# Patient Record
Sex: Male | Born: 1990 | Race: Black or African American | Hispanic: No | Smoking: Never smoker
Health system: Southern US, Community
[De-identification: ages and names within clinical notes are randomized; demographics above are authoritative.]

---
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
## 2017-04-21 ENCOUNTER — Encounter: Payer: Self-pay | Admitting: Emergency Medicine

## 2017-04-21 ENCOUNTER — Emergency Department
Admission: EM | Admit: 2017-04-21 | Discharge: 2017-04-21 | Disposition: A | Discharge status: Home / Self Care | Payer: Self-pay | Attending: Internal Medicine | Admitting: Internal Medicine

## 2017-04-21 ENCOUNTER — Other Ambulatory Visit: Payer: Self-pay

## 2017-04-21 DIAGNOSIS — J4540 Moderate persistent asthma, uncomplicated: Secondary | ICD-10-CM | POA: Insufficient documentation

## 2017-04-21 MED ORDER — IPRATROPIUM-ALBUTEROL 0.5-2.5 (3) MG/3ML IN SOLN
3.0000 mL | Freq: Once | RESPIRATORY_TRACT | Status: AC
Start: 2017-04-21 — End: 2017-04-21
  Administered 2017-04-21: 3 mL via RESPIRATORY_TRACT
  Filled 2017-04-21 (×2): qty 3

## 2017-04-21 MED ORDER — AZITHROMYCIN 250 MG PO TABS: ORAL_TABLET | ORAL | 0 refills | Status: DC

## 2017-04-21 MED ORDER — PREDNISONE 10 MG PO TABS: 50.0000 mg | ORAL_TABLET | Freq: Every day | ORAL | 0 refills | Status: DC

## 2017-04-21 NOTE — ED Triage Notes (Signed)
Pt to ed with c/o cough, congestion, sore throat x 1 week.

## 2017-04-21 NOTE — ED Provider Notes (Signed)
[facility] Emergency Department Provider Note  ____________________________________________  Time seen: Approximately 1:26 PM  I have reviewed the triage vital signs and the nursing notes.   HISTORY  Chief Complaint Nasal Congestion; Cough; and Sore Throat   HPI Marc Carson is a 27 y.o. male who presents to the emergency department for treatment and evaluation of nasal congestion cough, and sore throat that started about 1 week ago.  Cough has been productive at times.  Recent states that he has been wheezing despite using his albuterol inhaler.  He does have a significant past medical history for asthma.  He denies fever, vomiting, or diarrhea.  History reviewed. No pertinent past medical history.  There are no active problems to display for this patient.   History reviewed. No pertinent surgical history.  Prior to Admission medications   Medication Sig Start Date End Date Taking? Authorizing Provider  azithromycin (ZITHROMAX) 250 MG tablet 2 tablets today, then 1 tablet for the next 4 days. 04/21/17   Maalik Pinn B, FNP  predniSONE (DELTASONE) 10 MG tablet Take 5 tablets (50 mg total) by mouth daily. 04/21/17   Chinita Pesterriplett, Carle Fenech B, FNP    Allergies Patient has no known allergies.  History reviewed. No pertinent family history.  Social History Social History   Tobacco Use  . Smoking status: Never Smoker  . Smokeless tobacco: Never Used  Substance Use Topics  . Alcohol use: Yes  . Drug use: No    Review of Systems Constitutional: Negative for fever/chills ENT: Negative for sore throat. Cardiovascular: Denies chest pain. Respiratory: No shortness of breath.  Positive for cough. Gastrointestinal: Negative for nausea, no vomiting.  No diarrhea.  Musculoskeletal: Negative for body aches Skin: Negative for rash. Neurological: Negative for headaches ____________________________________________   PHYSICAL EXAM:  VITAL SIGNS: ED Triage Vitals   Enc Vitals Group     BP 04/21/17 0920 (!) 140/95     Pulse Rate 04/21/17 0920 72     Resp 04/21/17 0920 18     Temp 04/21/17 0920 98.1 F (36.7 C)     Temp Source 04/21/17 0920 Oral     SpO2 04/21/17 0920 95 %     Weight 04/21/17 0921 290 lb (131.5 kg)     Height 04/21/17 0921 6\' 2"  (1.88 m)     Head Circumference --      Peak Flow --      Pain Score 04/21/17 0920 6     Pain Loc --      Pain Edu? --      Excl. in GC? --     Constitutional: Alert and oriented.  Well appearing and in no acute distress. Eyes: Conjunctivae are normal. EOMI. Ears: Bilateral tympanic membranes appear normal. Nose: No congestion noted; no rhinnorhea. Mouth/Throat: Mucous membranes are moist.  Oropharynx clear. Tonsils not visualized. Neck: No stridor.  Lymphatic: No cervical lymphadenopathy. Cardiovascular: Normal rate, regular rhythm. Good peripheral circulation. Respiratory: Normal respiratory effort.  No retractions.  Scattered expiratory wheezes noted in bilateral bases. Gastrointestinal: Soft and nontender.  Musculoskeletal: FROM x 4 extremities.  Neurologic:  Normal speech and language.  Skin:  Skin is warm, dry and intact. No rash noted. Psychiatric: Mood and affect are normal. Speech and behavior are normal.  ____________________________________________   LABS (all labs ordered are listed, but only abnormal results are displayed)  Labs Reviewed - No data to display ____________________________________________  EKG  Not indicated ____________________________________________  RADIOLOGY  Not indicated ____________________________________________   PROCEDURES  Procedure(s) performed: None  Critical Care performed: No ____________________________________________   INITIAL IMPRESSION / ASSESSMENT AND PLAN / ED COURSE  27 year old male presenting to the emergency department for treatment and evaluation of symptoms most consistent with bronchitis.  While in the emergency  department today, he was given a DuoNeb treatment which significantly decreased the expiratory wheezes initially auscultated.  He will be treated with azithromycin, prednisone, and advised to continue using his albuterol at home.  He was advised to follow-up with the primary care provider for choice for symptoms that are not improving over the next couple of days.  He was advised to return to the emergency department for symptoms of change or worsen if he is unable to schedule an appointment.  Medications  ipratropium-albuterol (DUONEB) 0.5-2.5 (3) MG/3ML nebulizer solution 3 mL (3 mLs Nebulization Given 04/21/17 1131)    ED Discharge Orders        Ordered    predniSONE (DELTASONE) 10 MG tablet  Daily     04/21/17 1144    azithromycin (ZITHROMAX) 250 MG tablet     04/21/17 1144      Pertinent labs & imaging results that were available during my care of the patient were reviewed by me and considered in my medical decision making (see chart for details).    If controlled substance prescribed during this visit, 12 month history viewed on the [facility] prior to issuing an initial prescription for Schedule II or III opiod. ____________________________________________   FINAL CLINICAL IMPRESSION(S) / ED DIAGNOSES  Final diagnoses:  Moderate persistent asthmatic bronchitis without complication    Note:  This document was prepared using Dragon voice recognition software and may include unintentional dictation errors.     Chinita Pester, FNP 04/21/17 1329    Emily Filbert, MD 04/21/17 1359

## 2017-04-21 NOTE — Discharge Instructions (Signed)
Please follow up with the primary care provider of your choice for symptoms that are not improving over the next few days. ° °Return to the ER for symptoms that change or worsen if unable to schedule an appointment. °

## 2017-04-21 NOTE — ED Notes (Signed)
See triage note  States he developed nasal congestion,cough and sore throat about 1 week ago  States cough has been prod at times  Unsure of fever at home  But afebrile on arrival

## 2017-05-02 ENCOUNTER — Other Ambulatory Visit: Payer: Self-pay

## 2017-05-02 ENCOUNTER — Encounter: Payer: Self-pay | Admitting: Emergency Medicine

## 2017-05-02 ENCOUNTER — Emergency Department: Payer: Self-pay

## 2017-05-02 ENCOUNTER — Emergency Department
Admission: EM | Admit: 2017-05-02 | Discharge: 2017-05-02 | Disposition: A | Discharge status: Home / Self Care | Payer: Self-pay | Attending: Emergency Medicine | Admitting: Emergency Medicine

## 2017-05-02 DIAGNOSIS — B9789 Other viral agents as the cause of diseases classified elsewhere: Secondary | ICD-10-CM

## 2017-05-02 DIAGNOSIS — J069 Acute upper respiratory infection, unspecified: Secondary | ICD-10-CM | POA: Insufficient documentation

## 2017-05-02 MED ORDER — IPRATROPIUM-ALBUTEROL 0.5-2.5 (3) MG/3ML IN SOLN
3.0000 mL | Freq: Once | RESPIRATORY_TRACT | Status: AC
Start: 2017-05-02 — End: 2017-05-02
  Administered 2017-05-02: 3 mL via RESPIRATORY_TRACT
  Filled 2017-05-02: qty 3

## 2017-05-02 MED ORDER — PSEUDOEPH-BROMPHEN-DM 30-2-10 MG/5ML PO SYRP: 5.0000 mL | ORAL_SOLUTION | Freq: Four times a day (QID) | ORAL | 0 refills | Status: AC | PRN

## 2017-05-02 NOTE — ED Provider Notes (Signed)
[facility] Provider Note   ____________________________________________   First MD Initiated Contact with Patient 05/02/17 0703     (approximate)  I have reviewed the triage vital signs and the nursing notes.   HISTORY  Chief Complaint Cough  HPI Marc Carson is a 27 y.o. male here with complaint of cough for approximately 2-1/2 weeks.  Patient states he was diagnosed with bronchitis on 04/21/17 and was prescribed Zithromax and prednisone.  He states that he finished both of these and felt better for a couple days before feeling like he was getting worse.  He is used his inhaler 4 times daily over the last several weeks.  He denies any fever or chills.  He denies any productive cough, nausea, vomiting, diarrhea.  Patient denies any headache.  Taken over-the-counter medication for his cough without relief.  He last used his inhaler yesterday.  Patient has a history of asthma.  Currently he does not have a PCP.   History reviewed. No pertinent past medical history.  There are no active problems to display for this patient.   History reviewed. No pertinent surgical history.  Prior to Admission medications   Medication Sig Start Date End Date Taking? Authorizing Provider  brompheniramine-pseudoephedrine-DM 30-2-10 MG/5ML syrup Take 5 mLs by mouth 4 (four) times daily as needed. 05/02/17   Tommi Rumps, PA-C    Allergies Patient has no known allergies.  History reviewed. No pertinent family history.  Social History Social History   Tobacco Use  . Smoking status: Never Smoker  . Smokeless tobacco: Never Used  Substance Use Topics  . Alcohol use: Yes  . Drug use: No    Review of Systems Constitutional: No fever/chills Eyes: No visual changes. ENT: No sore throat.  Positive nasal congestion. Cardiovascular: Denies chest pain. Respiratory: Denies shortness of breath.  Positive nonproductive cough.  Positive occasional  wheezing. Gastrointestinal: No abdominal pain.  No nausea, no vomiting.  No diarrhea. Musculoskeletal: Negative for muscle aches. Skin: Negative for rash. Neurological: Negative for headaches ____________________________________________   PHYSICAL EXAM:  VITAL SIGNS: ED Triage Vitals [05/02/17 0354]  Enc Vitals Group     BP (!) 145/68     Pulse Rate 93     Resp 20     Temp 98.7 F (37.1 C)     Temp Source Oral     SpO2 96 %     Weight      Height      Head Circumference      Peak Flow      Pain Score      Pain Loc      Pain Edu?      Excl. in GC?    Constitutional: Alert and oriented. Well appearing and in no acute distress.  Talks in complete sentences without any difficulty. Eyes: Conjunctivae are normal.  Head: Atraumatic. Nose: Moderate congestion/rhinnorhea. Mouth/Throat: Mucous membranes are moist.  Oropharynx non-erythematous. Neck: No stridor.   Hematological/Lymphatic/Immunilogical: No cervical lymphadenopathy. Cardiovascular: Normal rate, regular rhythm. Grossly normal heart sounds.  Good peripheral circulation. Respiratory: Normal respiratory effort.  No retractions. Lungs rare occasional expiratory wheeze.  No rales or rhonchi. Gastrointestinal: Soft and nontender. No distention. Musculoskeletal: Moves upper and lower extremities then difficulty.  Normal gait was noted. Neurologic:  Normal speech and language. No gross focal neurologic deficits are appreciated.  Skin:  Skin is warm, dry and intact.  Psychiatric: Mood and affect are normal. Speech and behavior are normal.  ____________________________________________   LABS (all labs ordered are listed, but only abnormal results are displayed)  Labs Reviewed - No data to display  RADIOLOGY  Dg Chest 2 View  Result Date: 05/02/2017 CLINICAL DATA:  Chest pain. EXAM: CHEST  2 VIEW COMPARISON:  None. FINDINGS: Cardiomediastinal silhouette is normal. No pleural effusions or focal consolidations. Bandlike  density LEFT lung base with mildly elevated LEFT hemidiaphragm. Trachea projects midline and there is no pneumothorax. Soft tissue planes and included osseous structures are non-suspicious. IMPRESSION: LEFT lung base atelectasis/scarring. Electronically Signed   By: Awilda Metroourtnay  Bloomer M.D.   On: 05/02/2017 04:30    ____________________________________________   PROCEDURES  Procedure(s) performed: None  Procedures  Critical Care performed: No  ____________________________________________   INITIAL IMPRESSION / ASSESSMENT AND PLAN / ED COURSE Patient was given DuoNeb treatment in the department and coughing was greatly improved.  Patient was given a prescription for Bromfed-DM as needed for cough and congestion.  He is to continue using his inhaler as needed for wheezing and establish a PCP at [facility], [facility] or [facility]. ____________________________________________   FINAL CLINICAL IMPRESSION(S) / ED DIAGNOSES  Final diagnoses:  Viral URI with cough     ED Discharge Orders        Ordered    brompheniramine-pseudoephedrine-DM 30-2-10 MG/5ML syrup  4 times daily PRN     05/02/17 0811       Note:  This document was prepared using Dragon voice recognition software and may include unintentional dictation errors.    Tommi RumpsSummers, Timber Lucarelli L, PA-C 05/02/17 1302    Schaevitz, Myra Rudeavid Matthew, MD 05/02/17 [phone]

## 2017-05-02 NOTE — ED Notes (Addendum)
Spoke to pt in lobby about wait times; pt understands he'll be the first pt to be seen in Pod if not in a room before then; pt displeased but verbalized understanding

## 2017-05-02 NOTE — ED Notes (Signed)
See triage note  states he was seen and dx'd with bronchitis about 2 weeks ago.states he finished the meds but still is feeling bad  Unsure of fever  But has had occasional cough  [place] thinks his b/p is elevated

## 2017-05-02 NOTE — ED Triage Notes (Signed)
Pt presents with cough for 2 1/2- 2 weeks; was seen here on 04/21/17 and diagnosed with bronchitis; says he was prescribed antibiotic and prednisone, which he has finished, and is not feeling any better; pt talking in complete coherent sentences; in no acute distress

## 2017-05-02 NOTE — Discharge Instructions (Addendum)
Call [facility] an [facility] or get established with [facility], [facility], or [facility] for control of your asthma.  Begin taking Bromfed-DM as needed for cough and congestion.  Continue using your inhaler as needed for wheezing.  Increase fluids. At this time there are no additional antibiotics or prednisone needed.

## 2018-11-28 ENCOUNTER — Other Ambulatory Visit: Payer: Self-pay

## 2018-11-28 DIAGNOSIS — Z20822 Contact with and (suspected) exposure to covid-19: Secondary | ICD-10-CM

## 2018-11-29 LAB — NOVEL CORONAVIRUS, NAA: SARS-CoV-2, NAA: NOT DETECTED

## 2019-03-03 IMAGING — CR DG CHEST 2V
2 series · 2 of 2 positions shown · non-contrast
Comparison: None.

CLINICAL DATA: Chest pain.

EXAM:
CHEST  2 VIEW

[chest pa]
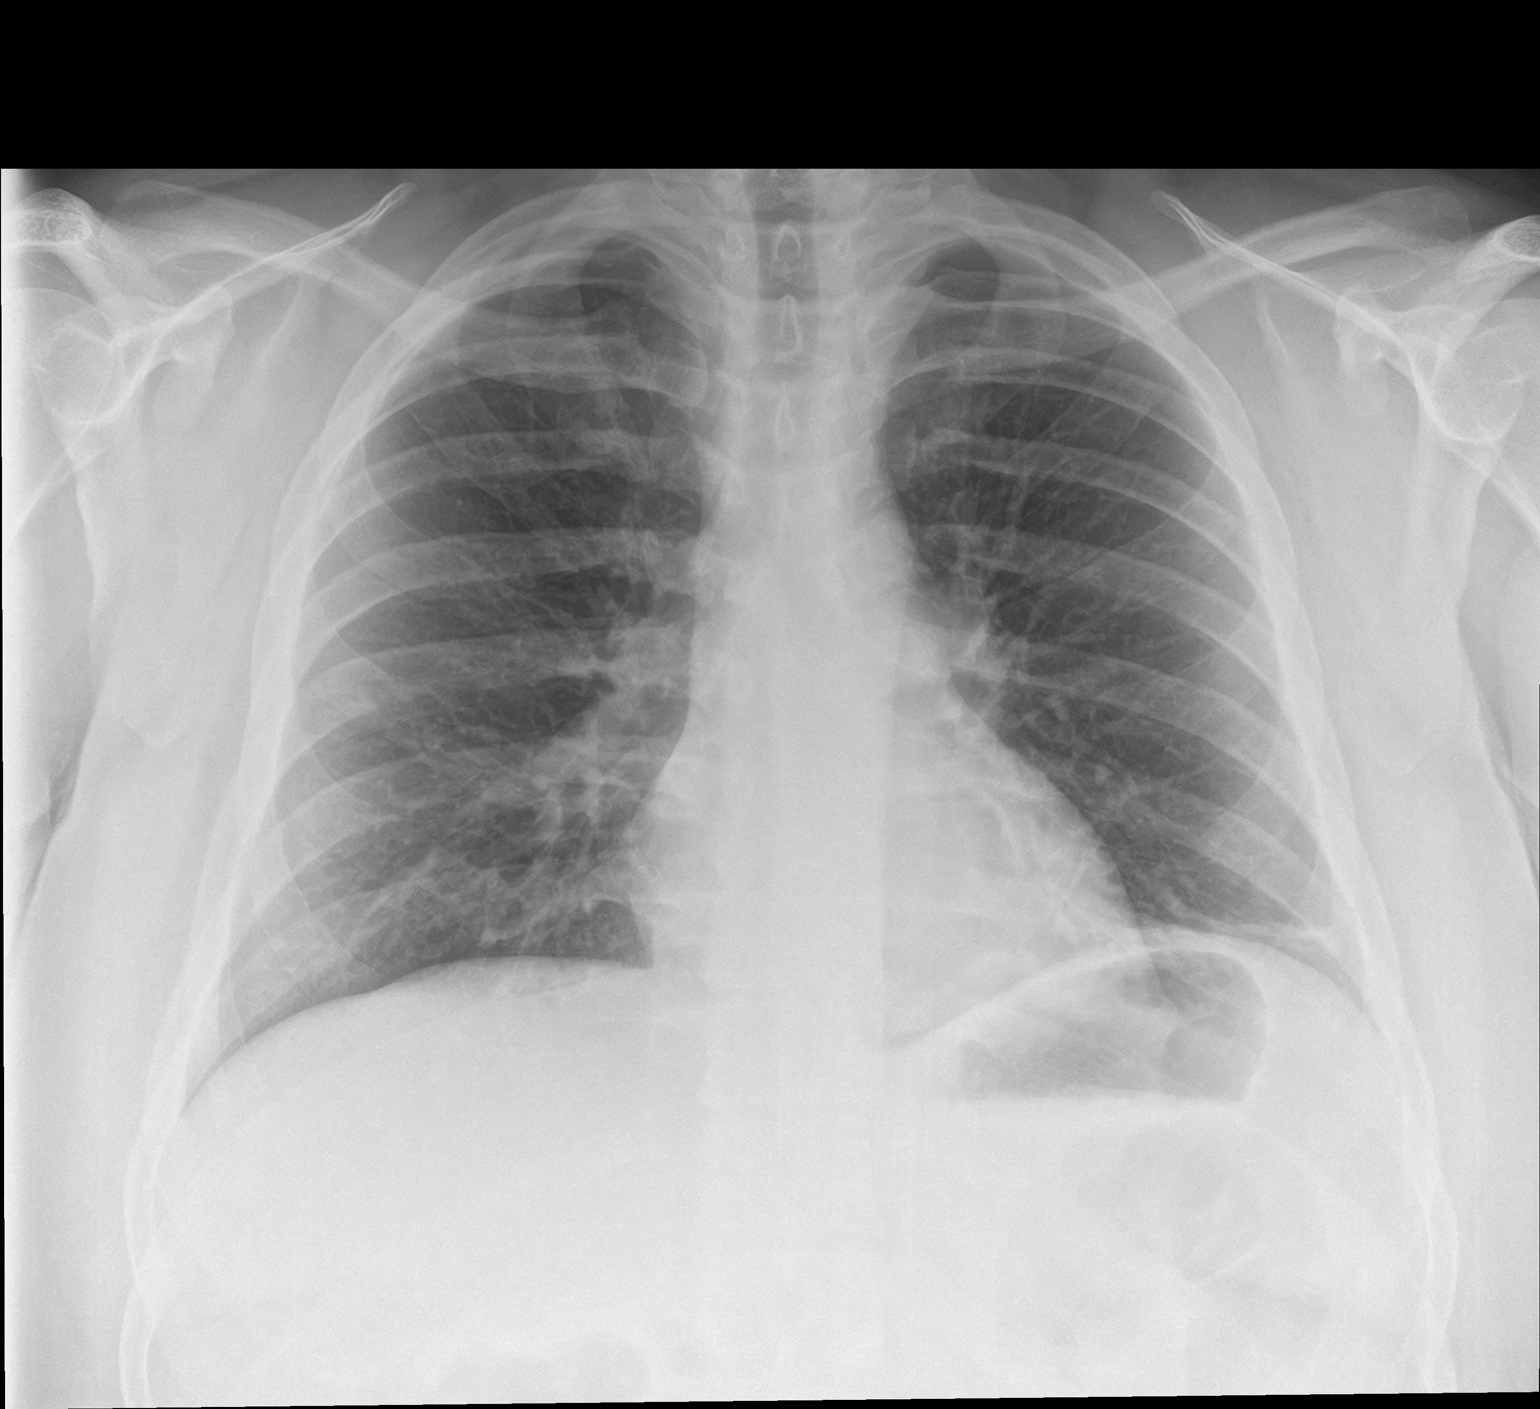

[chest lat]
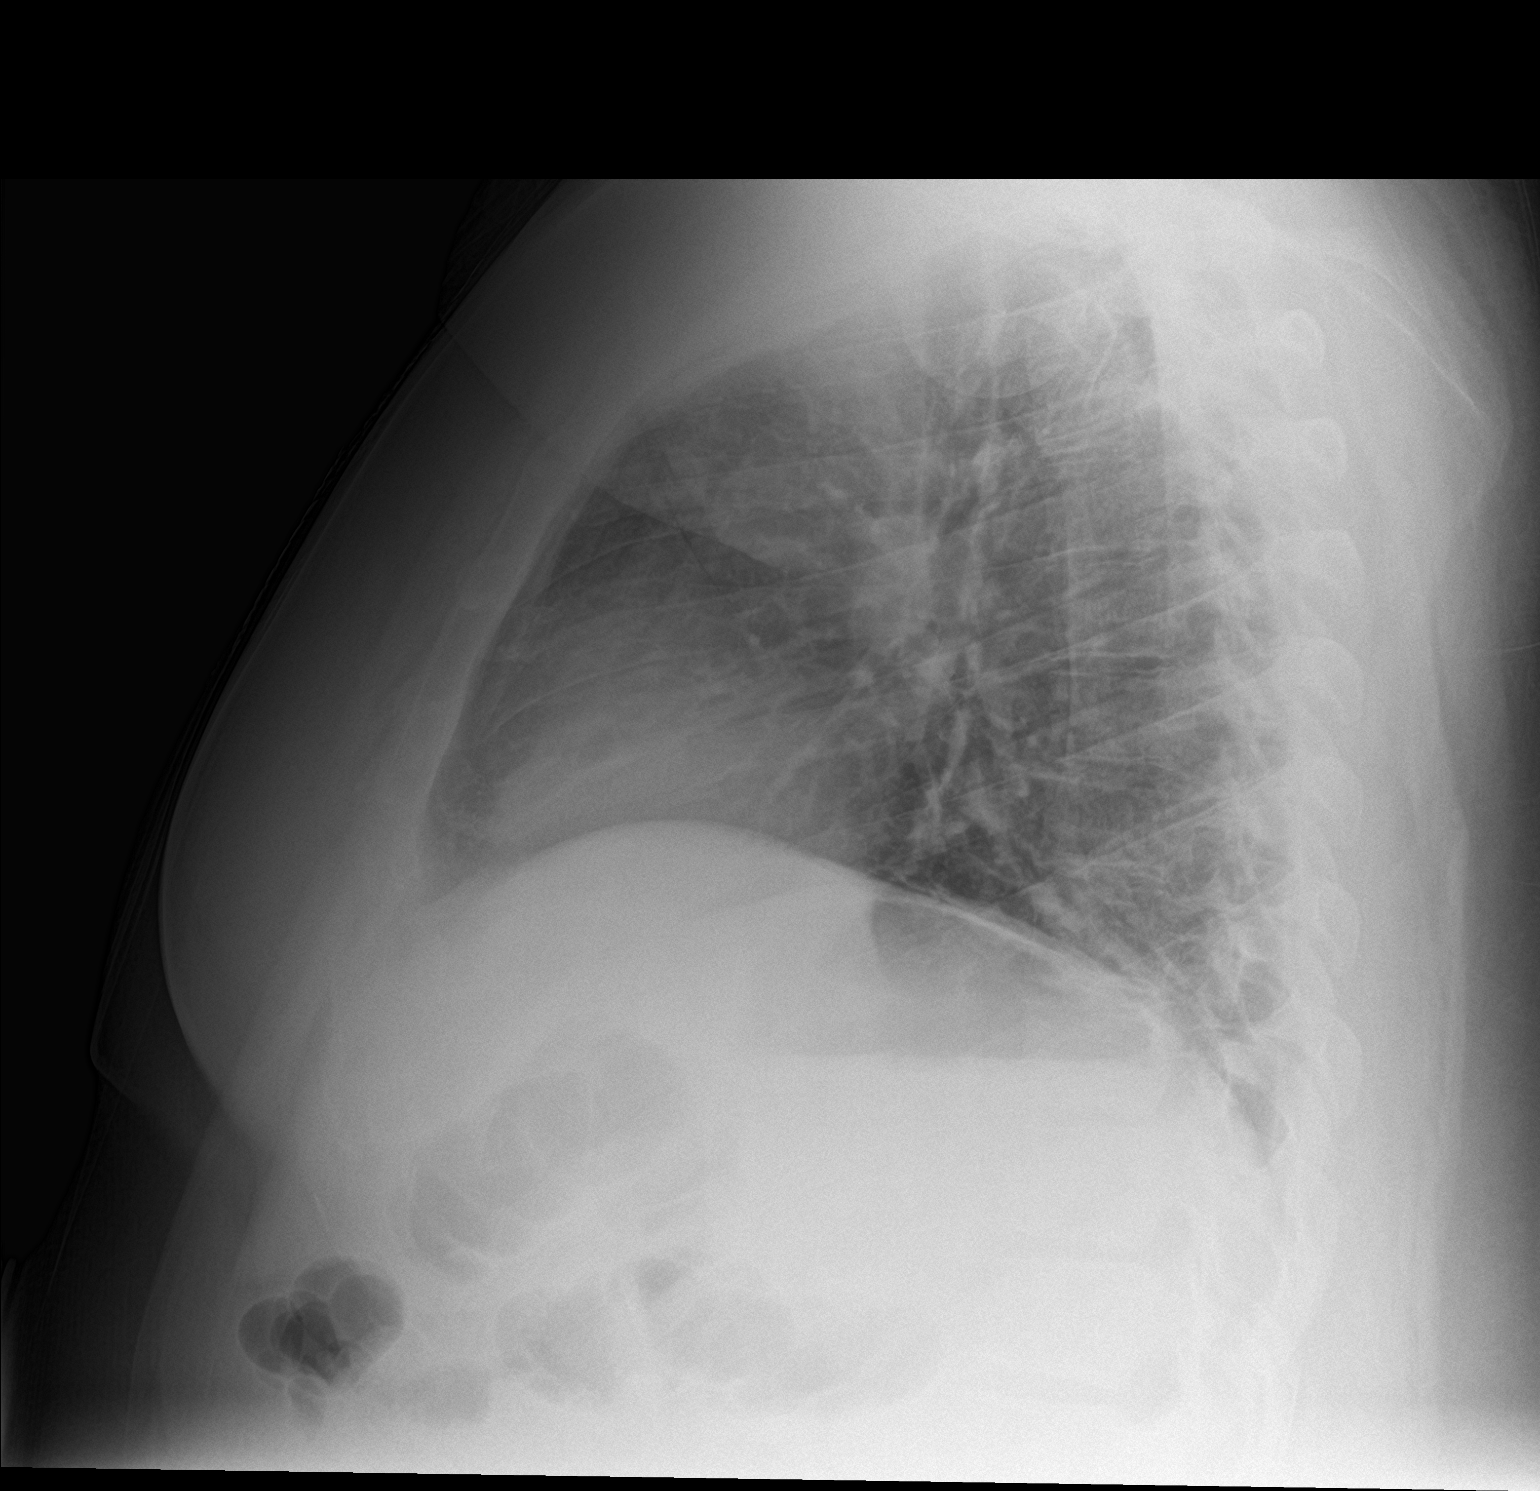

[2 of 2 positions shown; findings below may reference images not displayed]

FINDINGS: Cardiomediastinal silhouette is normal. No pleural effusions or
focal consolidations. Bandlike density LEFT lung base with mildly
elevated LEFT hemidiaphragm. Trachea projects midline and there is
no pneumothorax. Soft tissue planes and included osseous structures
are non-suspicious.
IMPRESSION: LEFT lung base atelectasis/scarring.

## 2022-05-02 ENCOUNTER — Other Ambulatory Visit: Payer: Self-pay

## 2022-05-02 ENCOUNTER — Emergency Department: Payer: BC Managed Care – PPO

## 2022-05-02 ENCOUNTER — Emergency Department
Admission: EM | Admit: 2022-05-02 | Discharge: 2022-05-02 | Disposition: A | Discharge status: Home / Self Care | Payer: BC Managed Care – PPO | Attending: Emergency Medicine | Admitting: Emergency Medicine

## 2022-05-02 DIAGNOSIS — Z7951 Long term (current) use of inhaled steroids: Secondary | ICD-10-CM | POA: Insufficient documentation

## 2022-05-02 DIAGNOSIS — J4531 Mild persistent asthma with (acute) exacerbation: Secondary | ICD-10-CM | POA: Insufficient documentation

## 2022-05-02 DIAGNOSIS — R7309 Other abnormal glucose: Secondary | ICD-10-CM | POA: Diagnosis not present

## 2022-05-02 DIAGNOSIS — Z1152 Encounter for screening for COVID-19: Secondary | ICD-10-CM | POA: Diagnosis not present

## 2022-05-02 DIAGNOSIS — J45901 Unspecified asthma with (acute) exacerbation: Secondary | ICD-10-CM

## 2022-05-02 DIAGNOSIS — R0602 Shortness of breath: Secondary | ICD-10-CM | POA: Diagnosis present

## 2022-05-02 LAB — BASIC METABOLIC PANEL
Anion gap: 6 (ref 5–15)
BUN: 9 mg/dL (ref 6–20)
CO2: 25 mmol/L (ref 22–32)
Calcium: 9.2 mg/dL (ref 8.9–10.3)
Chloride: 106 mmol/L (ref 98–111)
Creatinine, Ser: 0.88 mg/dL (ref 0.61–1.24)
GFR, Estimated: >60 mL/min (ref >60)
Glucose, Bld: 179 mg/dL — ABNORMAL HIGH (ref 70–99)
Potassium: 3.8 mmol/L (ref 3.5–5.1)
Sodium: 137 mmol/L (ref 135–145)

## 2022-05-02 LAB — CBC
HCT: 48.2 % (ref 39.0–52.0)
Hemoglobin: 15.5 g/dL (ref 13.0–17.0)
MCH: 26.9 pg (ref 26.0–34.0)
MCHC: 32.2 g/dL (ref 30.0–36.0)
MCV: 83.7 fL (ref 80.0–100.0)
Platelets: 321 10*3/uL (ref 150–400)
RBC: 5.76 MIL/uL (ref 4.22–5.81)
RDW: 13.6 % (ref 11.5–15.5)
WBC: 8.2 10*3/uL (ref 4.0–10.5)
nRBC: 0 % (ref 0.0–0.2)

## 2022-05-02 LAB — TROPONIN I (HIGH SENSITIVITY): Troponin I (High Sensitivity): 3 ng/L (ref <18)

## 2022-05-02 LAB — RESP PANEL BY RT-PCR (RSV, FLU A&B, COVID)  RVPGX2
Influenza A by PCR: NEGATIVE
Influenza B by PCR: NEGATIVE
Resp Syncytial Virus by PCR: NEGATIVE
SARS Coronavirus 2 by RT PCR: NEGATIVE

## 2022-05-02 MED ORDER — PREDNISONE 10 MG PO TABS: ORAL_TABLET | ORAL | 0 refills | Status: AC

## 2022-05-02 NOTE — Discharge Instructions (Addendum)
-  I suspect that your symptoms are likely related to chronic asthma.  Please take the full course of the prednisone as prescribed.  Continue taking your albuterol and corticosteroid inhaler as needed.  -Please call the pulmonologist listed on this page to schedule an appointment for further evaluation.  -Please follow through with your primary care appointment within the next week, as discussed.  -I will contact you if you are positive for COVID-19, influenza, or RSV.  -Return to the emergency department anytime if you begin to experience any new or worsening symptoms.

## 2022-05-02 NOTE — ED Provider Notes (Signed)
[facility] Provider Note    None    (approximate)   History   Chief Complaint Shortness of Breath   HPI Marc Carson is a 32 y.o. male, history of asthma, presents to the emergency department for evaluation of shortness of breath x 3 months.  He states that he has been utilizing his corticosteroid inhaler and albuterol inhaler, though this is not helping.  He was here on a couple occasions this past few weeks, was prescribed cough medicine and azithromycin, with little relief.  Denies fever/chills, chest pain, abdominal pain, flank pain, nausea/vomiting, diarrhea, urinary symptoms, rashes, headache, weakness, or dizziness/lightheadedness.  History Limitations: No limitations.        Physical Exam  Triage Vital Signs: ED Triage Vitals  Enc Vitals Group     BP 05/02/22 1056 (!) 146/77     Pulse Rate 05/02/22 1056 97     Resp 05/02/22 1056 18     Temp 05/02/22 1056 98.1 F (36.7 C)     Temp Source 05/02/22 1056 Oral     SpO2 05/02/22 1056 95 %     Weight 05/02/22 1053 (!) 325 lb (147.4 kg)     Height 05/02/22 1053 6\' 1"  (1.854 m)     Head Circumference --      Peak Flow --      Pain Score 05/02/22 1053 0     Pain Loc --      Pain Edu? --      Excl. in [place]? --     Most recent vital signs: Vitals:   05/02/22 1056  BP: (!) 146/77  Pulse: 97  Resp: 18  Temp: 98.1 F (36.7 C)  SpO2: 95%    General: Awake, NAD.  Skin: Warm, dry. No rashes or lesions.  Eyes: PERRL. Conjunctivae normal.  CV: Good peripheral perfusion.  Resp: Normal effort.  Mild wheezing in the bases bilaterally.  No rhonchi or rales. Abd: Soft, non-tender. No distention.  Neuro: At baseline. No gross neurological deficits.  Musculoskeletal: Normal ROM of all extremities  Physical Exam    ED Results / Procedures / Treatments  Labs (all labs ordered are listed, but only abnormal results are displayed) Labs Reviewed  BASIC METABOLIC PANEL - Abnormal; Notable for the  following components:      Result Value   Glucose, Bld 179 (*)    All other components within normal limits  RESP PANEL BY RT-PCR (RSV, FLU A&B, COVID)  RVPGX2  CBC  TROPONIN I (HIGH SENSITIVITY)     EKG Sinus rhythm, rate of 94, no ST segment changes, normal QRS, no QT prolongation.    RADIOLOGY  ED Provider Interpretation: I personally reviewed and interpreted this x-ray, no evidence of pneumonia.  Peribronchial thickening suggestive of asthma.  DG Chest 2 View  Result Date: 05/02/2022 CLINICAL DATA:  Shortness of breath. EXAM: CHEST - 2 VIEW COMPARISON:  05/02/2017 FINDINGS: The cardiomediastinal contours are normal. Mild peribronchial thickening. Pulmonary vasculature is normal. No consolidation, pleural effusion, or pneumothorax. No acute osseous abnormalities are seen. IMPRESSION: Mild peribronchial thickening suggesting bronchitis or asthma. Electronically Signed   By: Keith Rake M.D.   On: 05/02/2022 11:12    PROCEDURES:  Critical Care performed: N/A.  Procedures    MEDICATIONS ORDERED IN ED: Medications - No data to display   IMPRESSION / MDM / [place] / ED COURSE  I reviewed the triage vital signs and the nursing notes.  Differential diagnosis includes, but is not limited to, influenza, COVID-19, asthma exacerbation, RSV, bronchitis, pneumonia.  ED Course Patient appears well, vitals within normal limits.  NAD.  CBC shows no leukocytosis or anemia.  BMP shows elevated glucose at 179, otherwise no significant electrolyte abnormalities or AKI.  Initial troponin 3.  Assessment/Plan Presentation consistent with chronic asthma/bronchitis.  He has had very little relief over the past few months despite albuterol and corticosteroid inhaler.  His vitals are within normal limits.  Chest x-ray shows no evidence of pneumonia.  Clinically, there does not appear to be any infectious etiologies.  Low suspicion for any  occult pathology, such as pulmonary embolism.  He is PERC negative.  Will provide him with oral steroids and a referral to pulmonology.  Pending respiratory panel, will contact him if positive COVID-19, influenza, or RSV.  He was amenable to this.  Will discharge.  Considered admission for this patient, but given his stable presentation and unremarkable workup, he is unlikely to benefit from admission.  Provided the patient with anticipatory guidance, return precautions, and educational material. Encouraged the patient to return to the emergency department at any time if they begin to experience any new or worsening symptoms. Patient expressed understanding and agreed with the plan.   Patient's presentation is most consistent with acute complicated illness / injury requiring diagnostic workup.       FINAL CLINICAL IMPRESSION(S) / ED DIAGNOSES   Final diagnoses:  Mild asthma with exacerbation, unspecified whether persistent     Rx / DC Orders   ED Discharge Orders          Ordered    predniSONE (DELTASONE) 10 MG tablet  Q breakfast        05/02/22 1131             Note:  This document was prepared using Dragon voice recognition software and may include unintentional dictation errors.   Teodoro Spray, [place] 05/02/22 1158    Blake Divine, MD 05/02/22 [phone]

## 2022-05-02 NOTE — ED Triage Notes (Signed)
Pt states coming in with shortness of breath. Pt states he has had it for the last 3 months. Pt states being seen by providers and given steroids, without relief. Pt noted to have a cough. Pt denies current pain.

## 2023-01-03 ENCOUNTER — Encounter: Payer: Self-pay | Admitting: Urology

## 2023-01-03 ENCOUNTER — Ambulatory Visit: Payer: BC Managed Care – PPO | Admitting: Urology

## 2023-04-04 ENCOUNTER — Emergency Department
Admission: EM | Admit: 2023-04-04 | Discharge: 2023-04-04 | Disposition: A | Discharge status: Home / Self Care | Payer: BC Managed Care – PPO | Attending: Emergency Medicine | Admitting: Emergency Medicine

## 2023-04-04 ENCOUNTER — Other Ambulatory Visit: Payer: Self-pay

## 2023-04-04 ENCOUNTER — Emergency Department: Payer: BC Managed Care – PPO

## 2023-04-04 DIAGNOSIS — N132 Hydronephrosis with renal and ureteral calculous obstruction: Secondary | ICD-10-CM | POA: Insufficient documentation

## 2023-04-04 DIAGNOSIS — R1031 Right lower quadrant pain: Secondary | ICD-10-CM | POA: Diagnosis present

## 2023-04-04 DIAGNOSIS — N200 Calculus of kidney: Secondary | ICD-10-CM

## 2023-04-04 LAB — COMPREHENSIVE METABOLIC PANEL
ALT: 42 U/L (ref 0–44)
AST: 33 U/L (ref 15–41)
Albumin: 3.8 g/dL (ref 3.5–5.0)
Alkaline Phosphatase: 44 U/L (ref 38–126)
Anion gap: 9 (ref 5–15)
BUN: 7 mg/dL (ref 6–20)
CO2: 24 mmol/L (ref 22–32)
Calcium: 8.7 mg/dL — ABNORMAL LOW (ref 8.9–10.3)
Chloride: 105 mmol/L (ref 98–111)
Creatinine, Ser: 1.02 mg/dL (ref 0.61–1.24)
GFR, Estimated: >60 mL/min (ref >60)
Glucose, Bld: 155 mg/dL — ABNORMAL HIGH (ref 70–99)
Potassium: 3.4 mmol/L — ABNORMAL LOW (ref 3.5–5.1)
Sodium: 138 mmol/L (ref 135–145)
Total Bilirubin: 0.7 mg/dL (ref <1.2)
Total Protein: 6.4 g/dL — ABNORMAL LOW (ref 6.5–8.1)

## 2023-04-04 LAB — CBC
HCT: 44.9 % (ref 39.0–52.0)
Hemoglobin: 14.8 g/dL (ref 13.0–17.0)
MCH: 28.2 pg (ref 26.0–34.0)
MCHC: 33 g/dL (ref 30.0–36.0)
MCV: 85.7 fL (ref 80.0–100.0)
Platelets: 239 10*3/uL (ref 150–400)
RBC: 5.24 MIL/uL (ref 4.22–5.81)
RDW: 13.6 % (ref 11.5–15.5)
WBC: 9.6 10*3/uL (ref 4.0–10.5)
nRBC: 0 % (ref 0.0–0.2)

## 2023-04-04 LAB — LIPASE, BLOOD: Lipase: 28 U/L (ref 11–51)

## 2023-04-04 MED ORDER — IOHEXOL 300 MG/ML  SOLN
100.0000 mL | Freq: Once | INTRAMUSCULAR | Status: AC | PRN
  Administered 2023-04-04: 100 mL via INTRAVENOUS

## 2023-04-04 MED ORDER — KETOROLAC TROMETHAMINE 10 MG PO TABS: 10.0000 mg | ORAL_TABLET | Freq: Three times a day (TID) | ORAL | 0 refills | Status: AC | PRN

## 2023-04-04 MED ORDER — ONDANSETRON HCL 4 MG/2ML IJ SOLN
4.0000 mg | Freq: Once | INTRAMUSCULAR | Status: AC
  Administered 2023-04-04: 4 mg via INTRAVENOUS
  Filled 2023-04-04: qty 2

## 2023-04-04 MED ORDER — TAMSULOSIN HCL 0.4 MG PO CAPS: 0.4000 mg | ORAL_CAPSULE | Freq: Every day | ORAL | 0 refills | Status: AC

## 2023-04-04 MED ORDER — ONDANSETRON HCL 4 MG PO TABS: 4.0000 mg | ORAL_TABLET | Freq: Three times a day (TID) | ORAL | 0 refills | Status: AC | PRN

## 2023-04-04 MED ORDER — KETOROLAC TROMETHAMINE 30 MG/ML IJ SOLN
30.0000 mg | Freq: Once | INTRAMUSCULAR | Status: AC
  Administered 2023-04-04: 30 mg via INTRAVENOUS
  Filled 2023-04-04: qty 1

## 2023-04-04 MED ORDER — MORPHINE SULFATE (PF) 4 MG/ML IV SOLN
4.0000 mg | Freq: Once | INTRAVENOUS | Status: AC
  Administered 2023-04-04: 4 mg via INTRAVENOUS
  Filled 2023-04-04: qty 1
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
## 2023-04-04 NOTE — ED Notes (Signed)
See triage note  Presents with abd pain   States pain is in left side   Radiates into left lower quad No fever

## 2023-04-04 NOTE — Discharge Instructions (Signed)
As we discussed please follow up with your primary care provider for further evaluation of your liver. Please seek medical attention for any high fevers, chest pain, shortness of breath, change in behavior, persistent vomiting, bloody stool or any other new or concerning symptoms.
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
## 2023-04-14 ENCOUNTER — Other Ambulatory Visit: Payer: Self-pay | Admitting: Student

## 2023-04-14 DIAGNOSIS — K769 Liver disease, unspecified: Secondary | ICD-10-CM

## 2023-04-14 DIAGNOSIS — K7689 Other specified diseases of liver: Secondary | ICD-10-CM

## 2023-05-03 ENCOUNTER — Ambulatory Visit
Admission: RE | Admit: 2023-05-03 | Discharge: 2023-05-03 | Disposition: A | Discharge status: Home / Self Care | Payer: BC Managed Care – PPO | Source: Ambulatory Visit | Attending: Student

## 2023-05-03 DIAGNOSIS — K7689 Other specified diseases of liver: Secondary | ICD-10-CM

## 2023-05-03 DIAGNOSIS — K769 Liver disease, unspecified: Secondary | ICD-10-CM

## 2023-05-03 MED ORDER — GADOPICLENOL 0.5 MMOL/ML IV SOLN
10.0000 mL | Freq: Once | INTRAVENOUS | Status: AC | PRN
  Administered 2023-05-03: 10 mL via INTRAVENOUS

## 2023-06-07 NOTE — Progress Notes (Signed)
 Marc Carson is a 33 y.o. male in clinic today for evaluation of cough.  HPI: Patient reports cough since Friday night, about 4 days ago. He reports coughing up mucus. See ROS for symptoms. Patient reports his 33 year old is sick with a cough that comes and goes. Patient reports that he's taken mucinex and nyquil at home but reports it is minimally beneficial. Patient reports history of asthma, compliant with montelukast and budesonide, has not used his rescue inhaler.   Patient also inquires about possibility of lupus. He notes his mother has it as well as multiple other chronic issues and he wonders if his fatigue and getting sick more often could be related. He states awareness that it could be related to other things such as lifestyle and exposure but is curious about his risk.      ROS: Review of Systems  Constitutional:  Negative for chills, fever and malaise/fatigue.  HENT:  Positive for congestion. Negative for ear pain, sinus pain and sore throat.   Respiratory:  Positive for cough and shortness of breath (when laying down at night.). Negative for wheezing.   Cardiovascular:  Negative for chest pain.  Gastrointestinal:  Negative for diarrhea, nausea and vomiting.  Musculoskeletal:  Negative for myalgias.  Skin:  Negative for rash.  Neurological:  Negative for headaches.     No Known Allergies  Past Medical History:  Diagnosis Date  . Allergic rhinitis   . Asthma, unspecified asthma severity, unspecified whether complicated, unspecified whether persistent ([facility])   . Obese      BP 120/80   Pulse 93   Temp 36.7 C (98 F)   Ht 188 cm (6' 2)   Wt (!) 158.1 kg (348 lb 9.6 oz)   SpO2 97%   BMI 44.76 kg/m    Physical Exam Constitutional:      General: He is not in acute distress. HENT:     Right Ear: Tympanic membrane, ear canal and external ear normal.     Left Ear: Tympanic membrane, ear canal and external ear normal.     Nose: Nose normal.     Right Nostril: No  occlusion.     Left Nostril: No occlusion.     Right Sinus: No maxillary sinus tenderness or frontal sinus tenderness.     Left Sinus: No maxillary sinus tenderness or frontal sinus tenderness.     Mouth/Throat:     Dentition: Normal dentition.     Pharynx: Oropharynx is clear. Uvula midline.     Tonsils: No tonsillar exudate or tonsillar abscesses.  Cardiovascular:     Rate and Rhythm: Normal rate and regular rhythm.     Heart sounds: S1 normal and S2 normal. No murmur heard.    No friction rub. No gallop.  Pulmonary:     Effort: Pulmonary effort is normal.     Breath sounds: Examination of the right-upper field reveals wheezing. Examination of the left-upper field reveals wheezing. Wheezing (expiratory wheeze with cough) present.  Musculoskeletal:     Cervical back: Full passive range of motion without pain.  Lymphadenopathy:     Cervical: No cervical adenopathy.      Plan: Likely viral illness with possible exacerbation of asthma. Encourage patient to trial his rescue inhaler at night when he is feeling restricted lying down.   Prescribe prednisone  10 mg daily for 7 days for asthma and wheezing and benzonatate 100 mg TID PRN for cough.   Discussed that lifestyle and exposure are more likely key  factors in his immune health rather than lupus given absence of other symptoms. Encouraged lifestyle changes as well as immune support with supplementing vitamin C and taking zinc when ill. Encouraged patient to reach out for discussion and possible evaluation when he is well from acute illness if he desires.  Discussed indications for follow up. Patient denies further questions or concerns at this time.

## 2023-11-08 ENCOUNTER — Other Ambulatory Visit: Payer: Self-pay

## 2023-11-08 ENCOUNTER — Emergency Department
Admission: EM | Admit: 2023-11-08 | Discharge: 2023-11-08 | Disposition: A | Discharge status: Home / Self Care | Attending: Emergency Medicine | Admitting: Emergency Medicine

## 2023-11-08 ENCOUNTER — Emergency Department

## 2023-11-08 ENCOUNTER — Encounter: Payer: Self-pay | Admitting: Emergency Medicine

## 2023-11-08 DIAGNOSIS — R109 Unspecified abdominal pain: Secondary | ICD-10-CM | POA: Insufficient documentation

## 2023-11-08 DIAGNOSIS — D72829 Elevated white blood cell count, unspecified: Secondary | ICD-10-CM | POA: Diagnosis not present

## 2023-11-08 DIAGNOSIS — J45909 Unspecified asthma, uncomplicated: Secondary | ICD-10-CM | POA: Diagnosis not present

## 2023-11-08 LAB — URINALYSIS, ROUTINE W REFLEX MICROSCOPIC
Bacteria, UA: NONE SEEN
Bilirubin Urine: NEGATIVE
Glucose, UA: NEGATIVE mg/dL
Ketones, ur: NEGATIVE mg/dL
Leukocytes,Ua: NEGATIVE
Nitrite: NEGATIVE
Protein, ur: 30 mg/dL — AB
RBC / HPF: >50 RBC/hpf (ref 0–5)
Specific Gravity, Urine: 1.023 (ref 1.005–1.030)
WBC, UA: 0 WBC/hpf (ref 0–5)
pH: 5 (ref 5.0–8.0)

## 2023-11-08 LAB — CBC WITH DIFFERENTIAL/PLATELET
Abs Immature Granulocytes: 0.02 K/uL (ref 0.00–0.07)
Basophils Absolute: 0 K/uL (ref 0.0–0.1)
Basophils Relative: 0 %
Eosinophils Absolute: 0.2 K/uL (ref 0.0–0.5)
Eosinophils Relative: 2 %
HCT: 48.5 % (ref 39.0–52.0)
Hemoglobin: 15.5 g/dL (ref 13.0–17.0)
Immature Granulocytes: 0 %
Lymphocytes Relative: 49 %
Lymphs Abs: 5.8 K/uL — ABNORMAL HIGH (ref 0.7–4.0)
MCH: 26.5 pg (ref 26.0–34.0)
MCHC: 32 g/dL (ref 30.0–36.0)
MCV: 83 fL (ref 80.0–100.0)
Monocytes Absolute: 0.7 K/uL (ref 0.1–1.0)
Monocytes Relative: 6 %
Neutro Abs: 5.1 K/uL (ref 1.7–7.7)
Neutrophils Relative %: 43 %
Platelets: 270 K/uL (ref 150–400)
RBC: 5.84 MIL/uL — ABNORMAL HIGH (ref 4.22–5.81)
RDW: 14.1 % (ref 11.5–15.5)
Smear Review: NORMAL
WBC: 12 K/uL — ABNORMAL HIGH (ref 4.0–10.5)
nRBC: 0 % (ref 0.0–0.2)

## 2023-11-08 LAB — BASIC METABOLIC PANEL WITH GFR
Anion gap: 9 (ref 5–15)
BUN: 9 mg/dL (ref 6–20)
CO2: 23 mmol/L (ref 22–32)
Calcium: 8.8 mg/dL — ABNORMAL LOW (ref 8.9–10.3)
Chloride: 108 mmol/L (ref 98–111)
Creatinine, Ser: 1.06 mg/dL (ref 0.61–1.24)
GFR, Estimated: >60 mL/min (ref >60)
Glucose, Bld: 133 mg/dL — ABNORMAL HIGH (ref 70–99)
Potassium: 4 mmol/L (ref 3.5–5.1)
Sodium: 140 mmol/L (ref 135–145)

## 2023-11-08 MED ORDER — MORPHINE SULFATE (PF) 4 MG/ML IV SOLN
4.0000 mg | Freq: Once | INTRAVENOUS | Status: AC
  Administered 2023-11-08: 4 mg via INTRAVENOUS
  Filled 2023-11-08: qty 1

## 2023-11-08 MED ORDER — IBUPROFEN 600 MG PO TABS: 600.0000 mg | ORAL_TABLET | Freq: Four times a day (QID) | ORAL | 0 refills | Status: AC | PRN

## 2023-11-08 MED ORDER — SODIUM CHLORIDE 0.9 % IV BOLUS
1000.0000 mL | Freq: Once | INTRAVENOUS | Status: AC
  Administered 2023-11-08: 1000 mL via INTRAVENOUS

## 2023-11-08 MED ORDER — HYDROMORPHONE HCL 1 MG/ML IJ SOLN
1.0000 mg | Freq: Once | INTRAMUSCULAR | Status: DC
  Filled 2023-11-08: qty 1

## 2023-11-08 MED ORDER — ONDANSETRON HCL 4 MG/2ML IJ SOLN
4.0000 mg | Freq: Once | INTRAMUSCULAR | Status: AC
  Administered 2023-11-08: 4 mg via INTRAVENOUS
  Filled 2023-11-08: qty 2

## 2023-11-08 MED ORDER — KETOROLAC TROMETHAMINE 30 MG/ML IJ SOLN
30.0000 mg | Freq: Once | INTRAMUSCULAR | Status: AC
  Administered 2023-11-08: 30 mg via INTRAVENOUS
  Filled 2023-11-08: qty 1

## 2023-11-08 MED ORDER — OXYCODONE-ACETAMINOPHEN 5-325 MG PO TABS: 1.0000 | ORAL_TABLET | ORAL | 0 refills | Status: AC | PRN

## 2023-11-08 NOTE — Discharge Instructions (Addendum)
 There is a very small stone in the left kidney but likely the stone that has been causing her pain has passed.  You may take the pain medication as needed.  Return to the ER for new, worsening, or persistent severe pain, vomiting, or any other new or worsening symptoms that concern you.

## 2023-11-08 NOTE — ED Provider Notes (Signed)
 [facility] Provider Note    Event Date/Time   First MD Initiated Contact with Patient 11/08/23 2049     (approximate)   History   Flank Pain   HPI  Marc Carson is a 33 y.o. male  with a history of asthma, allergic rhinitis, and ureteral stones who presents with acute onset of left flank pain about an hour prior to coming to the hospital, associated with nausea, dysuria, and frequency.  He states it feels similar to prior kidney stones.  I reviewed the past medical records.  The patient's most recent outpatient encounter was on 2/18 with family medicine for evaluation of a cough.   Physical Exam   Triage Vital Signs: ED Triage Vitals  Encounter Vitals Group     BP 11/08/23 2042 135/87     Girls Systolic BP Percentile --      Girls Diastolic BP Percentile --      Boys Systolic BP Percentile --      Boys Diastolic BP Percentile --      Pulse Rate 11/08/23 2042 94     Resp 11/08/23 2042 (!) 23     Temp 11/08/23 2042 99 F (37.2 C)     Temp Source 11/08/23 2042 Oral     SpO2 11/08/23 2042 99 %     Weight 11/08/23 2044 (!) 325 lb 2.9 oz (147.5 kg)     Height 11/08/23 2044 6' 1 (1.854 m)     Head Circumference --      Peak Flow --      Pain Score 11/08/23 2044 10     Pain Loc --      Pain Education --      Exclude from Growth Chart --     Most recent vital signs: Vitals:   11/08/23 2042  BP: 135/87  Pulse: 94  Resp: (!) 23  Temp: 99 F (37.2 C)  SpO2: 99%     General: Alert, uncomfortable appearing, no distress.  CV:  Good peripheral perfusion.  Resp:  Normal effort.  Abd:  Soft and nontender.  No distention.  Other:  No jaundice or scleral icterus.   ED Results / Procedures / Treatments   Labs (all labs ordered are listed, but only abnormal results are displayed) Labs Reviewed  BASIC METABOLIC PANEL WITH GFR - Abnormal; Notable for the following components:      Result Value   Glucose, Bld 133 (*)    Calcium 8.8 (*)     All other components within normal limits  CBC WITH DIFFERENTIAL/PLATELET - Abnormal; Notable for the following components:   WBC 12.0 (*)    RBC 5.84 (*)    Lymphs Abs 5.8 (*)    All other components within normal limits  URINALYSIS, ROUTINE W REFLEX MICROSCOPIC - Abnormal; Notable for the following components:   Color, Urine YELLOW (*)    APPearance HAZY (*)    Hgb urine dipstick LARGE (*)    Protein, ur 30 (*)    All other components within normal limits     EKG    RADIOLOGY  CT renal stone study: I independently viewed and interpreted the images; there is a left renal stone with no ureteral stone or hydronephrosis.  PROCEDURES:  Critical Care performed: No  Procedures   MEDICATIONS ORDERED IN ED: Medications  sodium chloride  0.9 % bolus 1,000 mL (0 mLs Intravenous Stopped 11/08/23 2229)  ketorolac  (TORADOL ) 30 MG/ML injection 30 mg (30 mg Intravenous Given 11/08/23  2142)  morphine  (PF) 4 MG/ML injection 4 mg (4 mg Intravenous Given 11/08/23 2142)  ondansetron  (ZOFRAN ) injection 4 mg (4 mg Intravenous Given 11/08/23 2148)     IMPRESSION / MDM / ASSESSMENT AND PLAN / ED COURSE  I reviewed the triage vital signs and the nursing notes.  33 year old male with PMH as noted below presents with acute onset of left flank pain and urinary symptoms, similar to prior ureteral stones.    Differential diagnosis includes, but is not limited to, ureteral stone, pyelonephritis, musculoskeletal pain.  We will obtain basic labs, urinalysis, give fluids and analgesia and reassess.  Given the patient's history of multiple prior stones in the past, as long as he does not have intractable pain, at this time there is no indication for imaging.  Patient's presentation is most consistent with acute complicated illness / injury requiring diagnostic workup.  ----------------------------------------- 11:28 PM on 11/08/2023 -----------------------------------------  CT shows a left renal stone  but no ureteral stone or evidence of obstruction.  Urinalysis shows RBCs but no other acute findings.  BMP and CBC are unremarkable.  Overall I suspect that the patient likely had a small stone that has already passed.  He is pain-free at this time.  I counseled him on the results of the workup.  He is stable for discharge.  Return precautions given, and he expressed understanding.   FINAL CLINICAL IMPRESSION(S) / ED DIAGNOSES   Final diagnoses:  Left flank pain     Rx / DC Orders   ED Discharge Orders          Ordered    oxyCODONE -acetaminophen  (PERCOCET) 5-325 MG tablet  Every 4 hours PRN        11/08/23 2326    ibuprofen  (ADVIL ) 600 MG tablet  Every 6 hours PRN        11/08/23 2326             Note:  This document was prepared using Dragon voice recognition software and may include unintentional dictation errors.    Jacolyn Pae, MD 11/08/23 2329

## 2023-11-08 NOTE — ED Triage Notes (Signed)
 Pt with sudden onset of left side flank pain, dysuria, urinary frequency and urgency x1 hour. Pt with kidney stone hx and sts pain feels the same.

## 2023-11-08 NOTE — ED Notes (Signed)
 ED Provider at bedside.

## 2023-11-08 NOTE — ED Notes (Signed)
 Patient has asked this nurse and two others in ER for pain medication. Pt informed that he has to be evaluated by the MD, await orders, and then if they are ordered they will be given.

## 2023-11-08 NOTE — ED Notes (Signed)
 Blanket provided
